# Patient Record
Sex: Female | Born: 1985 | Race: White | Hispanic: No | Marital: Single | Smoking: Former smoker
Health system: Southern US, Academic
[De-identification: ages and names within clinical notes are randomized; demographics above are authoritative.]

---

## 2018-07-28 ENCOUNTER — Ambulatory Visit (INDEPENDENT_AMBULATORY_CARE_PROVIDER_SITE_OTHER): Payer: Self-pay | Admitting: Family

## 2019-01-25 ENCOUNTER — Ambulatory Visit (INDEPENDENT_AMBULATORY_CARE_PROVIDER_SITE_OTHER): Payer: Self-pay | Admitting: Family Medicine

## 2019-03-02 ENCOUNTER — Encounter (HOSPITAL_COMMUNITY): Payer: Self-pay

## 2019-03-02 ENCOUNTER — Emergency Department (HOSPITAL_COMMUNITY): Payer: MEDICAID

## 2019-03-02 ENCOUNTER — Other Ambulatory Visit: Payer: Self-pay

## 2019-03-02 ENCOUNTER — Emergency Department
Admission: EM | Admit: 2019-03-02 | Discharge: 2019-03-02 | Disposition: A | Discharge status: Home / Self Care | Payer: MEDICAID | Attending: Emergency Medicine | Admitting: Emergency Medicine

## 2019-03-02 DIAGNOSIS — Z87891 Personal history of nicotine dependence: Secondary | ICD-10-CM | POA: Insufficient documentation

## 2019-03-02 DIAGNOSIS — R0602 Shortness of breath: Secondary | ICD-10-CM | POA: Insufficient documentation

## 2019-03-02 DIAGNOSIS — R079 Chest pain, unspecified: Secondary | ICD-10-CM | POA: Insufficient documentation

## 2019-03-02 DIAGNOSIS — R062 Wheezing: Secondary | ICD-10-CM | POA: Insufficient documentation

## 2019-03-02 DIAGNOSIS — R05 Cough: Secondary | ICD-10-CM | POA: Insufficient documentation

## 2019-03-02 MED ORDER — PREDNISONE 10 MG TABLET
ORAL_TABLET | ORAL | 0 refills | Status: AC
Start: 2019-03-02 — End: ?

## 2019-03-02 MED ORDER — IPRATROPIUM BROMIDE 0.02 % SOLUTION FOR INHALATION
0.50 mg | RESPIRATORY_TRACT | Status: AC
Start: 2019-03-02 — End: 2019-03-02
  Administered 2019-03-02: 0.5 mg via RESPIRATORY_TRACT
  Filled 2019-03-02: qty 1

## 2019-03-02 MED ORDER — PREDNISONE 20 MG TABLET
60.00 mg | ORAL_TABLET | ORAL | Status: AC
Start: 2019-03-02 — End: 2019-03-02
  Administered 2019-03-02: 60 mg via ORAL
  Filled 2019-03-02: qty 3

## 2019-03-02 MED ORDER — ALBUTEROL SULFATE CONCENTRATE 2.5 MG/0.5 ML SOLUTION FOR NEBULIZATION
2.50 mg | INHALATION_SOLUTION | RESPIRATORY_TRACT | Status: AC
Start: 2019-03-02 — End: 2019-03-02
  Administered 2019-03-02: 2.5 mg via RESPIRATORY_TRACT
  Filled 2019-03-02: qty 1

## 2019-03-02 NOTE — ED Nurses Note (Signed)
Respiratory and X-Ray called.

## 2019-03-02 NOTE — ED Nurses Note (Signed)
Patient had taken BP cuff and pulse ox off to go to the bathroom and never put it back on .]  Patient discharged home with family.  AVS reviewed with patient/care giver.  A written copy of the AVS and discharge instructions was given to the patient/care giver.  Questions sufficiently answered as needed.  Patient/care giver encouraged to follow up with PCP as indicated.  In the event of an emergency, patient/care giver instructed to call 911 or go to the nearest emergency room.   Prescription given for Prednisone.     Current Discharge Medication List      START taking these medications.      Details   predniSONE 10 mg Tablet  Commonly known as:  DELTASONE   6 tabs for two days 5 tabs for two days 4 tabs for two days 3 tabs for two days 2 tabs for two day 1 tab for two days  Qty:  42 Tab  Refills:  0

## 2019-03-02 NOTE — ED Triage Notes (Signed)
Pt states that for the past 2 weeks she has been SOB, has chest tightness, body aches, cough, and nausea. Pt is eupneic and in NAD.

## 2019-03-02 NOTE — ED Provider Notes (Signed)
Victorino Sparrow, MD  [facility]  Emergency Department Visit Note    Date:  03/02/2019  Primary care provider:  No Pcp  Means of arrival:  private car  History obtained from: patient  History limited by: none    Chief Complaint:  Respiratory symptoms    HISTORY OF PRESENT ILLNESS     Adrienne Rosario, date of birth [birth year], is a 33 y.o. female who presents to the Emergency Department complaining of  Upper respiratory symptoms for the past 2 weeks (02/16/2019). Patient complains of shortness of breath, wheezing, cough, congestion, and chest pain. She was evaluated by urgent care a few weeks ago and diagnosed with pneumonia. Patient was placed on antibiotics, steroids, and given an inhaler with relief, however her symptoms returned after she finished the medications. She reports a history of childhood asthma, however not has an adult. She does not smoke cigarettes or use a vape. Patient denies any recent sick contacts. Denies vomiting, diarrhea, back pain, urinary symptoms, or fever.     REVIEW OF SYSTEMS     The pertinent positive and negative symptoms are as per HPI. All other systems reviewed and are negative.     PATIENT HISTORY     Past Medical History:  History reviewed. No pertinent past medical history.    Past Surgical History:  History reviewed. No pertinent surgical history.    Family History:  No history of acute family illness given at this time.     Social History:  Social History     Tobacco Use   . Smoking status: Former Research scientist (life sciences)   . Smokeless tobacco: Never Used   Substance Use Topics   . Alcohol use: Not Currently   . Drug use: Not Currently     Social History     Substance and Sexual Activity   Drug Use Not Currently       Medications:  No current outpatient medications on file.     Allergies:  No Known Allergies    PHYSICAL EXAM     Vitals:  Filed Vitals:    03/02/19 1901   BP: 140/80   Pulse: 90   Resp: 18   Temp: 37 C (98.6 F)   SpO2: 97%       Pulse ox  97% on None (Room Air)  interpreted by me as: Normal    Constitutional: No acute distress.   Head: Normocephalic and atraumatic.   ENT: Moist mucous membranes. No erythema or exudates in the oropharynx. No asymmetry.  Eyes: EOM are normal. Pupils are equal, round, and reactive to light. No scleral icterus.   Neck: Neck supple. No meningismus.  Cardiovascular: Normal rate and regular rhythm. No murmur heard.  2+ distal pulses all 4 extremities.  Pulmonary/Chest: Effort normal and diffuse polyphonic wheezing.    Abdominal: Soft. No distension. There is no tenderness. Normal bowel sounds present.   Back: There is no CVA tenderness.   Musculoskeletal: Normal range of motion. No edema and no tenderness. No clubbing or cyanosis.  Neurological: Patient is alert and oriented to person, place, and time. Strength and sensation normal in all extremities. Normal facial symmetry and speech.   Skin: Skin is warm and dry. No rash noted.     DIAGNOSTIC STUDIES     Radiology:    XR AP MOBILE CHEST (If patient condition warrants)   Final Result   Normal appearance of lungs. No active radiographic abnormality.  Radiologist location ID: [id number]           Radiological imaging interpreted by radiologist and independently reviewed by me.    EKG:  12 lead EKG interpreted by me shows NSR 89, normal axis, normal intervals, no acute ST or T wave abnormalities.    ED PROGRESS NOTE / MEDICAL DECISION MAKING     Old records reviewed by me:  I have reviewed the nurse's notes. I have reviewed the patient's problem list and pertinent past medical records.     Orders Placed This Encounter   . XR AP MOBILE CHEST (If patient condition warrants)   . ECG 12-LEAD   . predniSONE (DELTASONE) tablet   . AND Linked Order Group    . albuterol (PROVENTIL) 2.5mg / 0.5 mL nebulizer solution    . ipratropium (ATROVENT) 0.02% nebulizer solution       CXR and EKG ordered. Patient treated with Prednisone, Albuterol, and Atrovent.    20:04: Initial evaluation is complete at this  time. Patient is agreeable with the treatment plan at this time.     21:17: Reviewed patient's CXR.     21:22: On recheck, the patient is in no acute distress. I explained the results of the diagnostic studies. Patient will be discharged with Prednisone. Patient is to follow up with Laurence AlyMiller, Ryan, DO as needed. I discussed the diagnosis, disposition, and follow-up plan. The patient understood and is in accordance with the treatment plan at this time. Patient is to return here to the Emergency Department if new or worsening symptoms appear. All of their questions have been answered to their satisfaction. The patient is in stable condition at the time of discharge.     MIPS     Not applicable     OPIATE PRESCRIPTION       Not applicable    CORE MEASURES      Not applicable    CRITICAL CARE TIME      Not applicable     PRE-DISPOSITION VITALS      Pre-Disposition Vitals:  Filed Vitals:    03/02/19 2117 03/02/19 2145   BP:     Pulse:     Resp:     Temp:     SpO2: 95% 96%       CLINICAL IMPRESSION     Encounter Diagnosis   Name Primary?   . Wheezing Yes       DISPOSITION/PLAN     Discharged        Prescriptions:     Current Discharge Medication List      START taking these medications    Details   predniSONE (DELTASONE) 10 mg Oral Tablet 6 tabs for two days  5 tabs for two days  4 tabs for two days  3 tabs for two days  2 tabs for two day  1 tab for two days  Qty: 42 Tab, Refills: 0           Follow-Up:     Laurence AlyMiller, Ryan, DO  [address]  [place] [id number]  [phone]    Call   As needed      Condition at Disposition: Stable        SCRIBE ATTESTATION STATEMENT  I Lucille PassyBrianna Palmer, SCRIBE scribed for Idelle CrouchMuyderman, Gurkaran Rahm M, MD on 03/02/2019 at 8:04 PM.     Documentation assistance provided for Idelle CrouchMuyderman, Claudell Rhody M, MD  by Lucille PassyBrianna Palmer, SCRIBE. Information recorded by the scribe was done at my direction  and has been reviewed and validated by me Trixie Dredge Freddie Breech, MD.

## 2019-03-03 LAB — ECG 12-LEAD
Atrial Rate: 89 {beats}/min
Calculated P Axis: 70 degrees
Calculated R Axis: 22 degrees
Calculated T Axis: 39 degrees
PR Interval: 138 ms
QRS Duration: 76 ms
QT Interval: 472 ms
QTC Calculation: 574 ms
Ventricular rate: 89 {beats}/min
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
